# Patient Record
Sex: Female | Born: 1955
Health system: Southern US, Community
[De-identification: ages and names within clinical notes are randomized; demographics above are authoritative.]

## PROBLEM LIST (undated history)

## (undated) DIAGNOSIS — M8580 Other specified disorders of bone density and structure, unspecified site: Secondary | ICD-10-CM

## (undated) DIAGNOSIS — I10 Essential (primary) hypertension: Secondary | ICD-10-CM

## (undated) HISTORY — PX: CERVICAL DISC SURGERY: SHX588

## (undated) HISTORY — DX: Other specified disorders of bone density and structure, unspecified site: M85.80

## (undated) HISTORY — DX: Essential (primary) hypertension: I10

---

## 1996-10-02 HISTORY — PX: OTHER SURGICAL HISTORY: SHX169

## 1998-09-10 ENCOUNTER — Encounter: Payer: Self-pay | Admitting: Obstetrics and Gynecology

## 1998-09-10 ENCOUNTER — Ambulatory Visit (HOSPITAL_COMMUNITY): Admission: RE | Admit: 1998-09-10 | Discharge: 1998-09-10 | Payer: Self-pay | Admitting: Obstetrics and Gynecology

## 1998-09-17 ENCOUNTER — Ambulatory Visit (HOSPITAL_COMMUNITY): Admission: RE | Admit: 1998-09-17 | Discharge: 1998-09-17 | Payer: Self-pay | Admitting: Obstetrics and Gynecology

## 1998-09-17 ENCOUNTER — Encounter: Payer: Self-pay | Admitting: Obstetrics and Gynecology

## 1998-09-22 ENCOUNTER — Other Ambulatory Visit: Admission: RE | Admit: 1998-09-22 | Discharge: 1998-09-22 | Payer: Self-pay | Admitting: Obstetrics and Gynecology

## 1999-03-15 ENCOUNTER — Ambulatory Visit (HOSPITAL_COMMUNITY): Admission: RE | Admit: 1999-03-15 | Discharge: 1999-03-15 | Payer: Self-pay | Admitting: Obstetrics and Gynecology

## 1999-03-15 ENCOUNTER — Encounter: Payer: Self-pay | Admitting: Obstetrics and Gynecology

## 1999-04-12 ENCOUNTER — Encounter: Payer: Self-pay | Admitting: Family Medicine

## 1999-04-12 ENCOUNTER — Ambulatory Visit (HOSPITAL_COMMUNITY): Admission: RE | Admit: 1999-04-12 | Discharge: 1999-04-12 | Payer: Self-pay | Admitting: Family Medicine

## 1999-09-09 ENCOUNTER — Encounter: Payer: Self-pay | Admitting: Obstetrics and Gynecology

## 1999-09-09 ENCOUNTER — Ambulatory Visit (HOSPITAL_COMMUNITY): Admission: RE | Admit: 1999-09-09 | Discharge: 1999-09-09 | Payer: Self-pay

## 1999-09-15 ENCOUNTER — Other Ambulatory Visit: Admission: RE | Admit: 1999-09-15 | Discharge: 1999-09-15 | Payer: Self-pay | Admitting: Obstetrics and Gynecology

## 2000-09-12 ENCOUNTER — Ambulatory Visit (HOSPITAL_COMMUNITY): Admission: RE | Admit: 2000-09-12 | Discharge: 2000-09-12 | Payer: Self-pay | Admitting: Obstetrics and Gynecology

## 2000-09-12 ENCOUNTER — Encounter: Payer: Self-pay | Admitting: Obstetrics and Gynecology

## 2000-09-14 ENCOUNTER — Other Ambulatory Visit: Admission: RE | Admit: 2000-09-14 | Discharge: 2000-09-14 | Payer: Self-pay | Admitting: Obstetrics and Gynecology

## 2001-09-13 ENCOUNTER — Ambulatory Visit (HOSPITAL_COMMUNITY): Admission: RE | Admit: 2001-09-13 | Discharge: 2001-09-13 | Payer: Self-pay | Admitting: Obstetrics and Gynecology

## 2001-09-13 ENCOUNTER — Encounter: Payer: Self-pay | Admitting: Obstetrics and Gynecology

## 2002-09-15 ENCOUNTER — Ambulatory Visit (HOSPITAL_COMMUNITY): Admission: RE | Admit: 2002-09-15 | Discharge: 2002-09-15 | Payer: Self-pay | Admitting: Obstetrics and Gynecology

## 2002-09-15 ENCOUNTER — Encounter: Payer: Self-pay | Admitting: Obstetrics and Gynecology

## 2002-09-18 ENCOUNTER — Other Ambulatory Visit: Admission: RE | Admit: 2002-09-18 | Discharge: 2002-09-18 | Payer: Self-pay | Admitting: Obstetrics and Gynecology

## 2003-09-17 ENCOUNTER — Ambulatory Visit (HOSPITAL_COMMUNITY): Admission: RE | Admit: 2003-09-17 | Discharge: 2003-09-17 | Payer: Self-pay | Admitting: Obstetrics and Gynecology

## 2003-09-21 ENCOUNTER — Other Ambulatory Visit: Admission: RE | Admit: 2003-09-21 | Discharge: 2003-09-21 | Payer: Self-pay | Admitting: Obstetrics and Gynecology

## 2004-09-16 ENCOUNTER — Ambulatory Visit (HOSPITAL_COMMUNITY): Admission: RE | Admit: 2004-09-16 | Discharge: 2004-09-16 | Payer: Self-pay | Admitting: Obstetrics and Gynecology

## 2004-09-22 ENCOUNTER — Other Ambulatory Visit: Admission: RE | Admit: 2004-09-22 | Discharge: 2004-09-22 | Payer: Self-pay | Admitting: Obstetrics and Gynecology

## 2004-09-28 ENCOUNTER — Encounter: Admission: RE | Admit: 2004-09-28 | Discharge: 2004-09-28 | Payer: Self-pay | Admitting: Obstetrics and Gynecology

## 2005-09-22 ENCOUNTER — Ambulatory Visit (HOSPITAL_COMMUNITY): Admission: RE | Admit: 2005-09-22 | Discharge: 2005-09-22 | Payer: Self-pay | Admitting: Obstetrics and Gynecology

## 2005-09-28 ENCOUNTER — Other Ambulatory Visit: Admission: RE | Admit: 2005-09-28 | Discharge: 2005-09-28 | Payer: Self-pay | Admitting: Addiction Medicine

## 2006-09-27 ENCOUNTER — Ambulatory Visit (HOSPITAL_COMMUNITY): Admission: RE | Admit: 2006-09-27 | Discharge: 2006-09-27 | Payer: Self-pay | Admitting: Obstetrics and Gynecology

## 2006-10-01 ENCOUNTER — Other Ambulatory Visit: Admission: RE | Admit: 2006-10-01 | Discharge: 2006-10-01 | Payer: Self-pay | Admitting: Obstetrics and Gynecology

## 2007-09-30 ENCOUNTER — Ambulatory Visit (HOSPITAL_COMMUNITY): Admission: RE | Admit: 2007-09-30 | Discharge: 2007-09-30 | Payer: Self-pay | Admitting: Obstetrics and Gynecology

## 2007-10-11 ENCOUNTER — Other Ambulatory Visit: Admission: RE | Admit: 2007-10-11 | Discharge: 2007-10-11 | Payer: Self-pay | Admitting: Obstetrics and Gynecology

## 2008-10-19 ENCOUNTER — Encounter: Payer: Self-pay | Admitting: Obstetrics and Gynecology

## 2008-10-19 ENCOUNTER — Ambulatory Visit: Payer: Self-pay | Admitting: Obstetrics and Gynecology

## 2008-10-19 ENCOUNTER — Other Ambulatory Visit: Admission: RE | Admit: 2008-10-19 | Discharge: 2008-10-19 | Payer: Self-pay | Admitting: Obstetrics and Gynecology

## 2008-10-22 ENCOUNTER — Ambulatory Visit (HOSPITAL_COMMUNITY): Admission: RE | Admit: 2008-10-22 | Discharge: 2008-10-22 | Payer: Self-pay | Admitting: Obstetrics and Gynecology

## 2009-10-27 ENCOUNTER — Ambulatory Visit: Payer: Self-pay | Admitting: Obstetrics and Gynecology

## 2009-10-27 ENCOUNTER — Other Ambulatory Visit: Admission: RE | Admit: 2009-10-27 | Discharge: 2009-10-27 | Payer: Self-pay | Admitting: Obstetrics and Gynecology

## 2009-10-27 ENCOUNTER — Ambulatory Visit (HOSPITAL_COMMUNITY): Admission: RE | Admit: 2009-10-27 | Discharge: 2009-10-27 | Payer: Self-pay | Admitting: Obstetrics and Gynecology

## 2010-10-20 ENCOUNTER — Other Ambulatory Visit: Payer: Self-pay | Admitting: Obstetrics and Gynecology

## 2010-10-20 DIAGNOSIS — Z0000 Encounter for general adult medical examination without abnormal findings: Secondary | ICD-10-CM

## 2010-10-22 ENCOUNTER — Encounter: Payer: Self-pay | Admitting: Obstetrics and Gynecology

## 2010-11-08 ENCOUNTER — Ambulatory Visit (HOSPITAL_COMMUNITY)
Admission: RE | Admit: 2010-11-08 | Discharge: 2010-11-08 | Disposition: A | Discharge status: Home / Self Care | Payer: 59 | Source: Ambulatory Visit | Attending: Obstetrics and Gynecology | Admitting: Obstetrics and Gynecology

## 2010-11-08 DIAGNOSIS — Z0000 Encounter for general adult medical examination without abnormal findings: Secondary | ICD-10-CM

## 2010-11-08 DIAGNOSIS — Z1231 Encounter for screening mammogram for malignant neoplasm of breast: Secondary | ICD-10-CM | POA: Insufficient documentation

## 2010-11-09 ENCOUNTER — Ambulatory Visit (HOSPITAL_COMMUNITY): Admission: RE | Admit: 2010-11-09 | Payer: Self-pay | Source: Home / Self Care | Admitting: Obstetrics and Gynecology

## 2010-11-09 ENCOUNTER — Ambulatory Visit (HOSPITAL_COMMUNITY): Payer: Self-pay

## 2010-11-21 ENCOUNTER — Other Ambulatory Visit (HOSPITAL_COMMUNITY)
Admission: RE | Admit: 2010-11-21 | Discharge: 2010-11-21 | Disposition: A | Discharge status: Home / Self Care | Payer: 59 | Source: Ambulatory Visit | Attending: Obstetrics and Gynecology | Admitting: Obstetrics and Gynecology

## 2010-11-21 DIAGNOSIS — Z124 Encounter for screening for malignant neoplasm of cervix: Secondary | ICD-10-CM | POA: Insufficient documentation

## 2010-11-22 ENCOUNTER — Other Ambulatory Visit: Payer: Self-pay | Admitting: Obstetrics and Gynecology

## 2010-11-22 ENCOUNTER — Encounter (INDEPENDENT_AMBULATORY_CARE_PROVIDER_SITE_OTHER): Payer: 59 | Admitting: Obstetrics and Gynecology

## 2010-11-22 DIAGNOSIS — Z01419 Encounter for gynecological examination (general) (routine) without abnormal findings: Secondary | ICD-10-CM

## 2011-02-09 ENCOUNTER — Other Ambulatory Visit: Payer: Self-pay | Admitting: Family Medicine

## 2011-02-09 DIAGNOSIS — M79601 Pain in right arm: Secondary | ICD-10-CM

## 2011-02-13 ENCOUNTER — Ambulatory Visit
Admission: RE | Admit: 2011-02-13 | Discharge: 2011-02-13 | Disposition: A | Discharge status: Home / Self Care | Payer: 59 | Source: Ambulatory Visit | Attending: Family Medicine | Admitting: Family Medicine

## 2011-02-13 DIAGNOSIS — M79601 Pain in right arm: Secondary | ICD-10-CM

## 2011-04-10 ENCOUNTER — Ambulatory Visit
Admission: RE | Admit: 2011-04-10 | Discharge: 2011-04-10 | Disposition: A | Discharge status: Home / Self Care | Payer: 59 | Source: Ambulatory Visit | Attending: Neurological Surgery | Admitting: Neurological Surgery

## 2011-04-10 ENCOUNTER — Other Ambulatory Visit: Payer: Self-pay | Admitting: Neurological Surgery

## 2011-04-10 DIAGNOSIS — M542 Cervicalgia: Secondary | ICD-10-CM

## 2011-07-10 ENCOUNTER — Other Ambulatory Visit: Payer: Self-pay | Admitting: Neurological Surgery

## 2011-07-10 ENCOUNTER — Ambulatory Visit
Admission: RE | Admit: 2011-07-10 | Discharge: 2011-07-10 | Disposition: A | Discharge status: Home / Self Care | Payer: 59 | Source: Ambulatory Visit | Attending: Neurological Surgery | Admitting: Neurological Surgery

## 2011-07-10 DIAGNOSIS — M542 Cervicalgia: Secondary | ICD-10-CM

## 2011-08-10 ENCOUNTER — Encounter: Payer: Self-pay | Admitting: Gynecology

## 2011-08-10 DIAGNOSIS — I10 Essential (primary) hypertension: Secondary | ICD-10-CM | POA: Insufficient documentation

## 2011-08-15 ENCOUNTER — Ambulatory Visit (INDEPENDENT_AMBULATORY_CARE_PROVIDER_SITE_OTHER): Payer: 59

## 2011-08-15 ENCOUNTER — Other Ambulatory Visit: Payer: Self-pay | Admitting: Obstetrics and Gynecology

## 2011-08-15 DIAGNOSIS — M949 Disorder of cartilage, unspecified: Secondary | ICD-10-CM

## 2011-08-15 DIAGNOSIS — Z13820 Encounter for screening for osteoporosis: Secondary | ICD-10-CM

## 2011-08-15 DIAGNOSIS — M8580 Other specified disorders of bone density and structure, unspecified site: Secondary | ICD-10-CM

## 2011-08-15 DIAGNOSIS — M899 Disorder of bone, unspecified: Secondary | ICD-10-CM

## 2011-10-13 ENCOUNTER — Other Ambulatory Visit: Payer: Self-pay | Admitting: Obstetrics and Gynecology

## 2011-10-13 DIAGNOSIS — Z1231 Encounter for screening mammogram for malignant neoplasm of breast: Secondary | ICD-10-CM

## 2011-10-16 ENCOUNTER — Ambulatory Visit
Admission: RE | Admit: 2011-10-16 | Discharge: 2011-10-16 | Disposition: A | Discharge status: Home / Self Care | Payer: 59 | Source: Ambulatory Visit | Attending: Neurological Surgery | Admitting: Neurological Surgery

## 2011-10-16 ENCOUNTER — Other Ambulatory Visit: Payer: Self-pay | Admitting: Neurological Surgery

## 2011-10-16 DIAGNOSIS — M4802 Spinal stenosis, cervical region: Secondary | ICD-10-CM

## 2011-10-16 DIAGNOSIS — M542 Cervicalgia: Secondary | ICD-10-CM

## 2011-11-10 ENCOUNTER — Ambulatory Visit (HOSPITAL_COMMUNITY)
Admission: RE | Admit: 2011-11-10 | Discharge: 2011-11-10 | Disposition: A | Discharge status: Home / Self Care | Payer: 59 | Source: Ambulatory Visit | Attending: Obstetrics and Gynecology | Admitting: Obstetrics and Gynecology

## 2011-11-10 DIAGNOSIS — Z1231 Encounter for screening mammogram for malignant neoplasm of breast: Secondary | ICD-10-CM | POA: Insufficient documentation

## 2011-11-28 ENCOUNTER — Other Ambulatory Visit (HOSPITAL_COMMUNITY)
Admission: RE | Admit: 2011-11-28 | Discharge: 2011-11-28 | Disposition: A | Discharge status: Home / Self Care | Payer: 59 | Source: Ambulatory Visit | Attending: Obstetrics and Gynecology | Admitting: Obstetrics and Gynecology

## 2011-11-28 ENCOUNTER — Ambulatory Visit (INDEPENDENT_AMBULATORY_CARE_PROVIDER_SITE_OTHER): Payer: 59 | Admitting: Obstetrics and Gynecology

## 2011-11-28 ENCOUNTER — Encounter: Payer: Self-pay | Admitting: Obstetrics and Gynecology

## 2011-11-28 VITALS — BP 136/80 | Ht 60.5 in | Wt 206.0 lb

## 2011-11-28 DIAGNOSIS — M899 Disorder of bone, unspecified: Secondary | ICD-10-CM

## 2011-11-28 DIAGNOSIS — Z01419 Encounter for gynecological examination (general) (routine) without abnormal findings: Secondary | ICD-10-CM

## 2011-11-28 DIAGNOSIS — M8580 Other specified disorders of bone density and structure, unspecified site: Secondary | ICD-10-CM | POA: Insufficient documentation

## 2011-11-28 NOTE — Progress Notes (Signed)
Patient came to see me today for her annual GYN exam. She had elevated FSH in 2010. She is stopped pills. She did actually have 3 periods the last year. She is not contracepting. She knows that although  pregnancy is unlikely it is not impossible. She is having no pelvic pain. She is up-to-date on mammograms. She is at baseline bone density in December which showed osteopenia without an elevated FRAX risk. She's had no fractures. She does lab through PCP. When I saw her last year she was having lack of energy and feeling bad. We offered her HRT. She decided not to take it. She now feels fine.  Physical examination: Jessica Ramirez gardner present. HEENT within normal limits. Neck: Thyroid not large. No masses. Supraclavicular nodes: not enlarged. Breasts: Examined in both sitting midline position. No skin changes and no masses. Abdomen: Soft no guarding rebound or masses or hernia. Pelvic: External: Within normal limits. BUS: Within normal limits. Vaginal:within normal limits. Good estrogen effect. No evidence of cystocele rectocele or enterocele. Cervix: clean. Uterus: Normal size and shape. Adnexa: No masses. Rectovaginal exam: Confirmatory and negative. Extremities: Within normal limits.  Assessment: Osteopenia  Plan: Discussed bone density. Start her on calcium and vitamin D. Discussed issues with getting pregnant accidentally. Discussed condoms. Patient has elected to continue not to use birth control. Continue yearly mammograms.

## 2011-11-29 LAB — URINALYSIS W MICROSCOPIC + REFLEX CULTURE
Bacteria, UA: NONE SEEN
Bilirubin Urine: NEGATIVE
Casts: NONE SEEN
Crystals: NONE SEEN
Glucose, UA: NEGATIVE mg/dL
Hgb urine dipstick: NEGATIVE
Ketones, ur: NEGATIVE mg/dL
Leukocytes, UA: NEGATIVE
Protein, ur: NEGATIVE mg/dL
Urobilinogen, UA: 0.2 mg/dL (ref 0.0–1.0)
pH: 6 (ref 5.0–8.0)

## 2012-11-11 ENCOUNTER — Other Ambulatory Visit: Payer: Self-pay | Admitting: Gynecology

## 2012-11-11 DIAGNOSIS — Z1231 Encounter for screening mammogram for malignant neoplasm of breast: Secondary | ICD-10-CM

## 2012-11-21 ENCOUNTER — Ambulatory Visit (HOSPITAL_COMMUNITY)
Admission: RE | Admit: 2012-11-21 | Discharge: 2012-11-21 | Disposition: A | Discharge status: Home / Self Care | Payer: 59 | Source: Ambulatory Visit | Attending: Gynecology | Admitting: Gynecology

## 2012-11-21 DIAGNOSIS — Z1231 Encounter for screening mammogram for malignant neoplasm of breast: Secondary | ICD-10-CM | POA: Insufficient documentation

## 2012-11-28 ENCOUNTER — Ambulatory Visit (INDEPENDENT_AMBULATORY_CARE_PROVIDER_SITE_OTHER): Payer: 59 | Admitting: Gynecology

## 2012-11-28 ENCOUNTER — Encounter: Payer: Self-pay | Admitting: Gynecology

## 2012-11-28 ENCOUNTER — Other Ambulatory Visit: Payer: Self-pay | Admitting: Gynecology

## 2012-11-28 ENCOUNTER — Other Ambulatory Visit (HOSPITAL_COMMUNITY)
Admission: RE | Admit: 2012-11-28 | Discharge: 2012-11-28 | Disposition: A | Discharge status: Home / Self Care | Payer: 59 | Source: Ambulatory Visit | Attending: Gynecology | Admitting: Gynecology

## 2012-11-28 VITALS — BP 124/74 | Ht 62.0 in | Wt 194.0 lb

## 2012-11-28 DIAGNOSIS — N390 Urinary tract infection, site not specified: Secondary | ICD-10-CM

## 2012-11-28 DIAGNOSIS — L292 Pruritus vulvae: Secondary | ICD-10-CM

## 2012-11-28 DIAGNOSIS — L293 Anogenital pruritus, unspecified: Secondary | ICD-10-CM

## 2012-11-28 DIAGNOSIS — R350 Frequency of micturition: Secondary | ICD-10-CM

## 2012-11-28 DIAGNOSIS — Z01419 Encounter for gynecological examination (general) (routine) without abnormal findings: Secondary | ICD-10-CM | POA: Insufficient documentation

## 2012-11-28 LAB — URINALYSIS W MICROSCOPIC + REFLEX CULTURE
Bilirubin Urine: NEGATIVE
Casts: NONE SEEN
Crystals: NONE SEEN
Glucose, UA: NEGATIVE mg/dL
Ketones, ur: NEGATIVE mg/dL
Nitrite: NEGATIVE
Protein, ur: NEGATIVE mg/dL
Urobilinogen, UA: 0.2 mg/dL (ref 0.0–1.0)
pH: 6 (ref 5.0–8.0)

## 2012-11-28 MED ORDER — NITROFURANTOIN MONOHYD MACRO 100 MG PO CAPS: 100.0000 mg | ORAL_CAPSULE | Freq: Two times a day (BID) | ORAL | Status: DC

## 2012-11-28 MED ORDER — FLUCONAZOLE 200 MG PO TABS: 200.0000 mg | ORAL_TABLET | Freq: Every day | ORAL | Status: DC

## 2012-11-28 NOTE — Patient Instructions (Signed)
Take Macrobid antibiotic twice daily for 7 days. Take Diflucan pill daily for 3 days. Followup if your symptoms persist or recur. Follow up in one year for annual exam. Report if you have prolonged or atypical bleeding.

## 2012-11-28 NOTE — Progress Notes (Signed)
Jessica Ramirez 12-23-1955 [id number]        57 y.o.  G0P0 for annual exam.  Former patient of Dr. Eda Paschal with several issues.  Past medical history,surgical history, medications, allergies, family history and social history were all reviewed and documented in the EPIC chart. ROS:  Was performed and pertinent positives and negatives are included in the history.  Exam: Kim assistant Filed Vitals:   11/28/12 1540  BP: 124/74  Height: 5\' 2"  (1.575 m)  Weight: 194 lb (87.998 kg)   General appearance  Normal Skin grossly normal Head/Neck normal with no cervical or supraclavicular adenopathy thyroid normal Lungs  clear Cardiac RR, without RMG Abdominal  soft, nontender, without masses, organomegaly or hernia Breasts  examined lying and sitting without masses, retractions, discharge or axillary adenopathy. Pelvic  Ext/BUS/vagina  normal   Cervix  normal Pap/HPV  Uterus  axial, normal size, shape and contour, midline and mobile nontender   Adnexa  Without masses or tenderness    Anus and perineum  normal   Rectovaginal  normal sphincter tone without palpated masses or tenderness.    Assessment/Plan:  57 y.o. G0P0 female for annual exam.   1. UTI. Patient with several days of frequency dysuria and urgency incontinence. No fever chills or other symptoms. Urinalysis consistent with UTI. Will treat with Macrobid 100 twice a day time 7 days. Samples of Urogesic #4 given to take every 6 hours. 2. Vulvar perianal pruritus. Followed colonoscopy. I suspect she received antibiotics and probably has a low-grade fungal. We'll cover with Diflucan 200 milligrams daily x3 days followup if symptoms persist or recur. 3. Perimenopausal. Just had menses and was 8 months from her prior menses. No real significant hot flashes or sweats.  Will keep menstrual calendar as long as less frequent but regular menses we'll follow. It goes more than one year without bleeding and then has bleeding or as prolonged or  atypical bleeding she does report this. Need for contraception until she is one year past menses discussed. 4. Mammography 11/2012 normal. Continue with annual mammography. SBE monthly reviewed. 5. Pap smear 11/2011. Pap done today. Discussed current screening guidelines but she was uncomfortable with less frequent screening. 6. Colonoscopy 09/2012. 7. Health maintenance. No blood work done today as it is all done through her primary physician's office. Followup in one year or sooner if any issues.    Dara Lords MD, 4:37 PM 11/28/2012

## 2012-11-30 LAB — URINE CULTURE: Colony Count: >=100000

## 2012-12-12 ENCOUNTER — Telehealth: Payer: Self-pay | Admitting: *Deleted

## 2012-12-12 MED ORDER — CIPROFLOXACIN HCL 250 MG PO TABS: 250.0000 mg | ORAL_TABLET | Freq: Two times a day (BID) | ORAL | Status: DC

## 2012-12-12 MED ORDER — PHENAZOPYRIDINE HCL 200 MG PO TABS: 200.0000 mg | ORAL_TABLET | Freq: Three times a day (TID) | ORAL | Status: DC | PRN

## 2012-12-12 NOTE — Telephone Encounter (Signed)
Pt was given Macrobid 100 twice a day time 7 days. Samples of Urogesic #4 given to take every 6 hours 11/28/12. Pt said yesterday symptoms returned pressure and frequency, no temp, she was cold this am. She asked if another rx could be given and urogesic as well? Please advise

## 2012-12-12 NOTE — Telephone Encounter (Signed)
Pt informed with the below note. 

## 2012-12-12 NOTE — Telephone Encounter (Signed)
Ciprofloxacin 250 mg twice a day x7 days, Pyridium 200 mg 3 times a day times #10. Orders done

## 2013-10-24 ENCOUNTER — Other Ambulatory Visit: Payer: Self-pay | Admitting: Gynecology

## 2013-10-24 DIAGNOSIS — Z1231 Encounter for screening mammogram for malignant neoplasm of breast: Secondary | ICD-10-CM

## 2013-11-24 ENCOUNTER — Ambulatory Visit (HOSPITAL_COMMUNITY)
Admission: RE | Admit: 2013-11-24 | Discharge: 2013-11-24 | Disposition: A | Discharge status: Home / Self Care | Payer: PRIVATE HEALTH INSURANCE | Source: Ambulatory Visit | Attending: Gynecology | Admitting: Gynecology

## 2013-11-24 DIAGNOSIS — Z1231 Encounter for screening mammogram for malignant neoplasm of breast: Secondary | ICD-10-CM | POA: Insufficient documentation

## 2013-12-01 ENCOUNTER — Ambulatory Visit (INDEPENDENT_AMBULATORY_CARE_PROVIDER_SITE_OTHER): Payer: Managed Care, Other (non HMO) | Admitting: Gynecology

## 2013-12-01 ENCOUNTER — Encounter: Payer: Self-pay | Admitting: Gynecology

## 2013-12-01 VITALS — BP 120/78 | Ht 60.0 in | Wt 190.0 lb

## 2013-12-01 DIAGNOSIS — L290 Pruritus ani: Secondary | ICD-10-CM

## 2013-12-01 DIAGNOSIS — L293 Anogenital pruritus, unspecified: Secondary | ICD-10-CM

## 2013-12-01 DIAGNOSIS — Z01419 Encounter for gynecological examination (general) (routine) without abnormal findings: Secondary | ICD-10-CM

## 2013-12-01 DIAGNOSIS — L292 Pruritus vulvae: Secondary | ICD-10-CM

## 2013-12-01 DIAGNOSIS — M949 Disorder of cartilage, unspecified: Secondary | ICD-10-CM

## 2013-12-01 DIAGNOSIS — M8580 Other specified disorders of bone density and structure, unspecified site: Secondary | ICD-10-CM

## 2013-12-01 DIAGNOSIS — M899 Disorder of bone, unspecified: Secondary | ICD-10-CM

## 2013-12-01 MED ORDER — NYSTATIN-TRIAMCINOLONE 100000-0.1 UNIT/GM-% EX OINT: 1.0000 "application " | TOPICAL_OINTMENT | Freq: Two times a day (BID) | CUTANEOUS | Status: DC

## 2013-12-01 MED ORDER — FLUCONAZOLE 200 MG PO TABS: 200.0000 mg | ORAL_TABLET | Freq: Every day | ORAL | Status: DC

## 2013-12-01 NOTE — Patient Instructions (Signed)
Followup in one year for annual exam, sooner if any issues 

## 2013-12-01 NOTE — Progress Notes (Signed)
Jessica Ramirez 10/23/1955 [id number]        58 y.o.  G0P0 for annual exam.  Several issues below.  Past medical history,surgical history, problem list, medications, allergies, family history and social history were all reviewed and documented in the EPIC chart.  ROS:  Performed and pertinent positives and negatives are included in the history, assessment and plan .  Exam: Jessica Ramirez assistant Filed Vitals:   12/01/13 1549  BP: 120/78  Height: 5' (1.524 m)  Weight: 190 lb (86.183 kg)   General appearance  Normal Skin grossly normal Head/Neck normal with no cervical or supraclavicular adenopathy thyroid normal Lungs  clear Cardiac RR, without RMG Abdominal  soft, nontender, without masses, organomegaly or hernia Breasts  examined lying and sitting without masses, retractions, discharge or axillary adenopathy. Pelvic  Ext/BUS/vagina normal  Cervix normal  Uterus anteverted, normal size, shape and contour, midline and mobile nontender   Adnexa  Without masses or tenderness    Anus and perineum  Normal   Rectovaginal  Normal sphincter tone without palpated masses or tenderness.    Assessment/Plan:  58 y.o. G0P0 female for annual exam.   1. Postmenopausal. Patient's last menstrual period was February 2014. No bleeding since. Having minimal hot flushes and night sweats. No issues with vaginal dryness or dyspareunia. Will continue to monitor. Patient knows to report any vaginal bleeding. 2. Pruritus ani. Patient has an issue with intermittent perianal vulvar itching. Was treated with Diflucan last year but cannot remember how well it worked. Exam grossly is normal. I did not do a wet prep is I'm going to treat her regardless with Diflucan 200 mg daily x5 days and Mytrex cream nightly as needed. Followup if symptoms persist, worsen or recur. 3. Osteopenia. DEXA 08/2011 T score -1.3. FRAX 4.6%/0.3%. Repeat DEXA now at 2-1/2 year interval. Increase calcium vitamin D reviewed. 4. Pap smear 2014. No  Pap smear done today. No history of significant abnormal Pap smears. Plan repeat Pap smear at 3 year interval. 5. Mammography 11/2013. Continue with annual mammography. SBE monthly reviewed. 6. Colonoscopy 2013. Repeat at their recommended interval. 7. Health maintenance. No blood work done as she reports this all done through her primary physician's office. Followup one year, sooner as needed.   Note: This document was prepared with digital dictation and possible smart phrase technology. Any transcriptional errors that result from this process are unintentional.   Jessica Ramirez,Jessica Ramirez P MD, 4:34 PM 12/01/2013

## 2014-01-30 DIAGNOSIS — M8580 Other specified disorders of bone density and structure, unspecified site: Secondary | ICD-10-CM

## 2014-01-30 HISTORY — DX: Other specified disorders of bone density and structure, unspecified site: M85.80

## 2014-02-16 ENCOUNTER — Ambulatory Visit (INDEPENDENT_AMBULATORY_CARE_PROVIDER_SITE_OTHER): Payer: Managed Care, Other (non HMO)

## 2014-02-16 DIAGNOSIS — M949 Disorder of cartilage, unspecified: Secondary | ICD-10-CM

## 2014-02-16 DIAGNOSIS — M8580 Other specified disorders of bone density and structure, unspecified site: Secondary | ICD-10-CM

## 2014-02-16 DIAGNOSIS — M899 Disorder of bone, unspecified: Secondary | ICD-10-CM

## 2014-02-24 ENCOUNTER — Encounter: Payer: Self-pay | Admitting: Gynecology

## 2014-04-13 ENCOUNTER — Telehealth: Payer: Self-pay | Admitting: *Deleted

## 2014-04-13 NOTE — Telephone Encounter (Signed)
Pt calling c/o UTI requesting Rx, I explained to patient OV best. Pt in boone [place] now and will go to urgent care.

## 2014-05-04 IMAGING — MG MM DIGITAL SCREENING BILAT
5 series · 5 of 5 positions shown · non-contrast
Comparison: Previous exams.

CLINICAL DATA: Screening.

DIGITAL BILATERAL SCREENING MAMMOGRAM WITH CAD

[R CC]
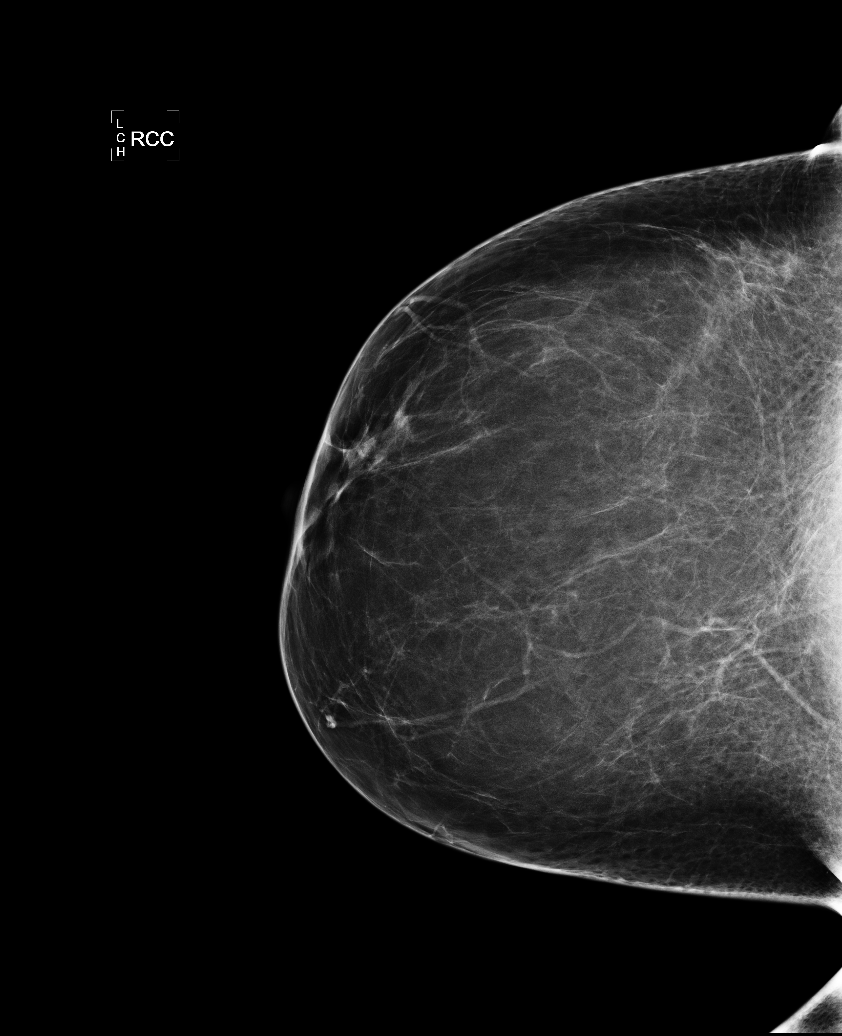

[R MLO]
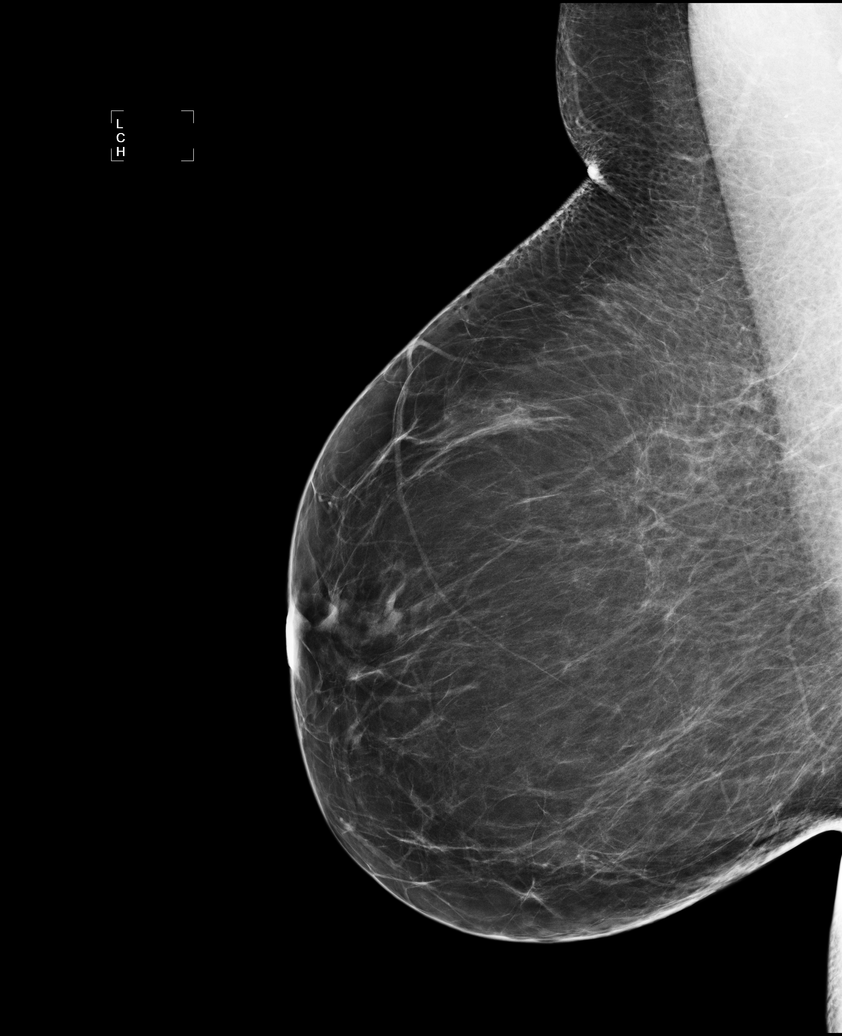

[L CC]
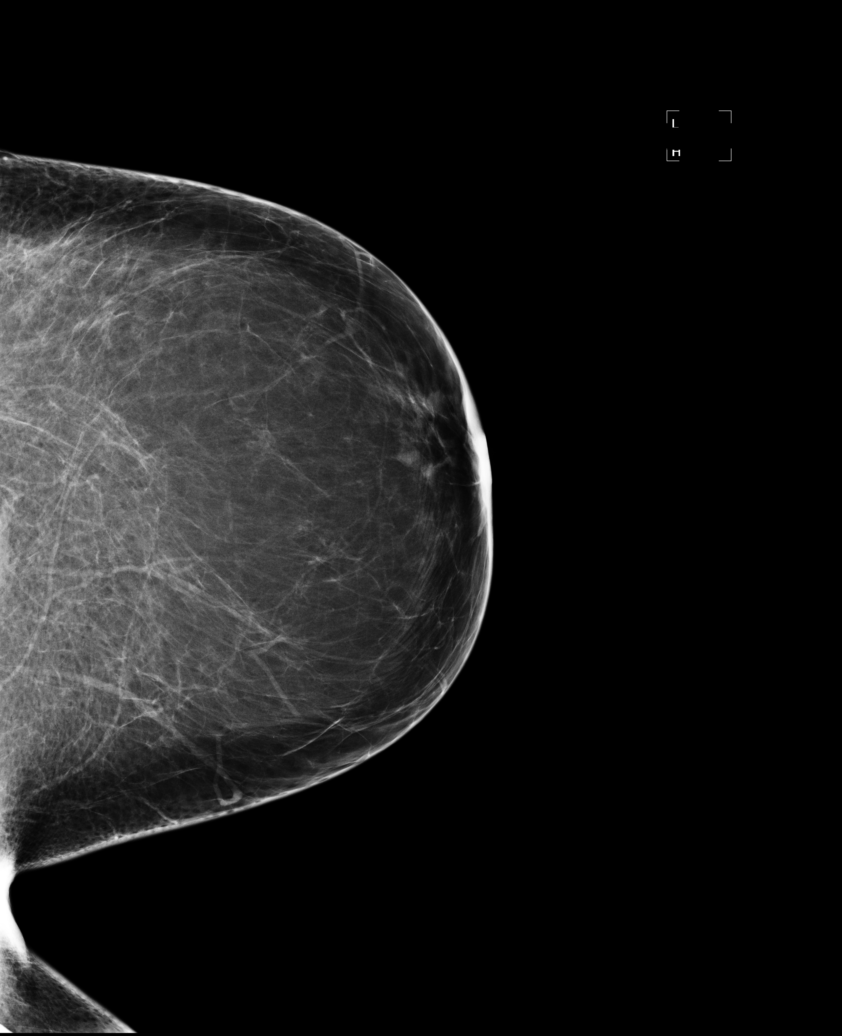

[L MLO (1 of 2)]
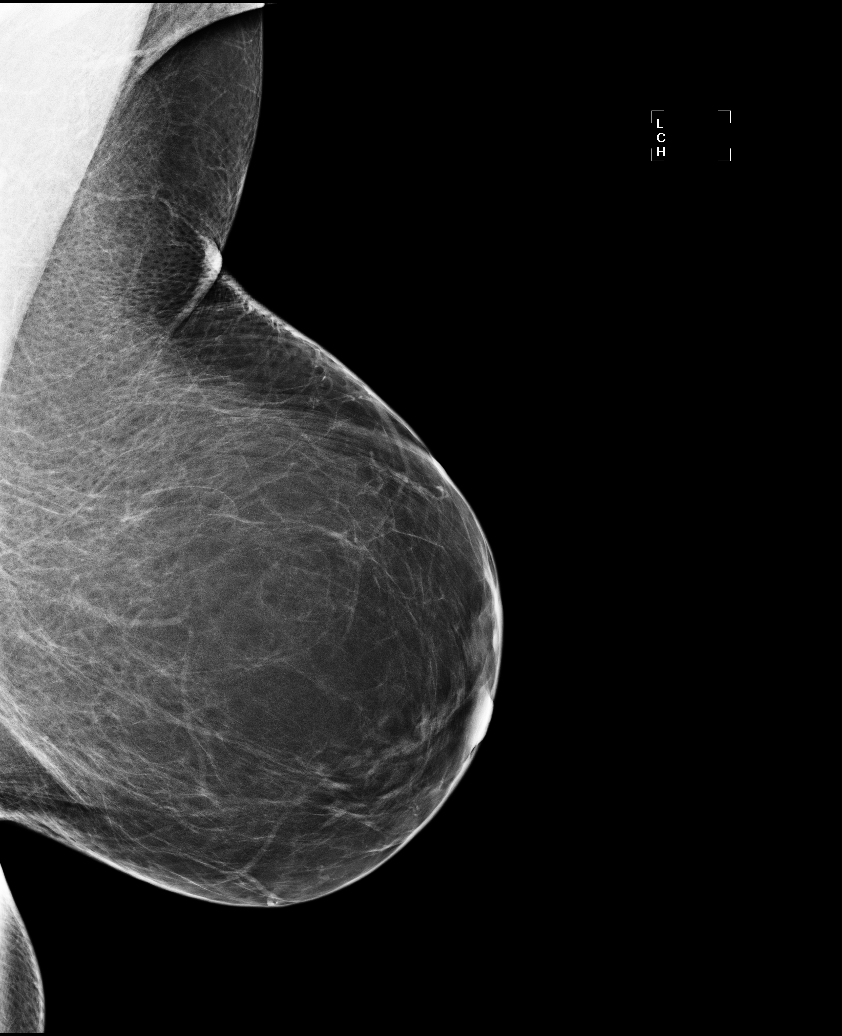

[L MLO (2 of 2)]
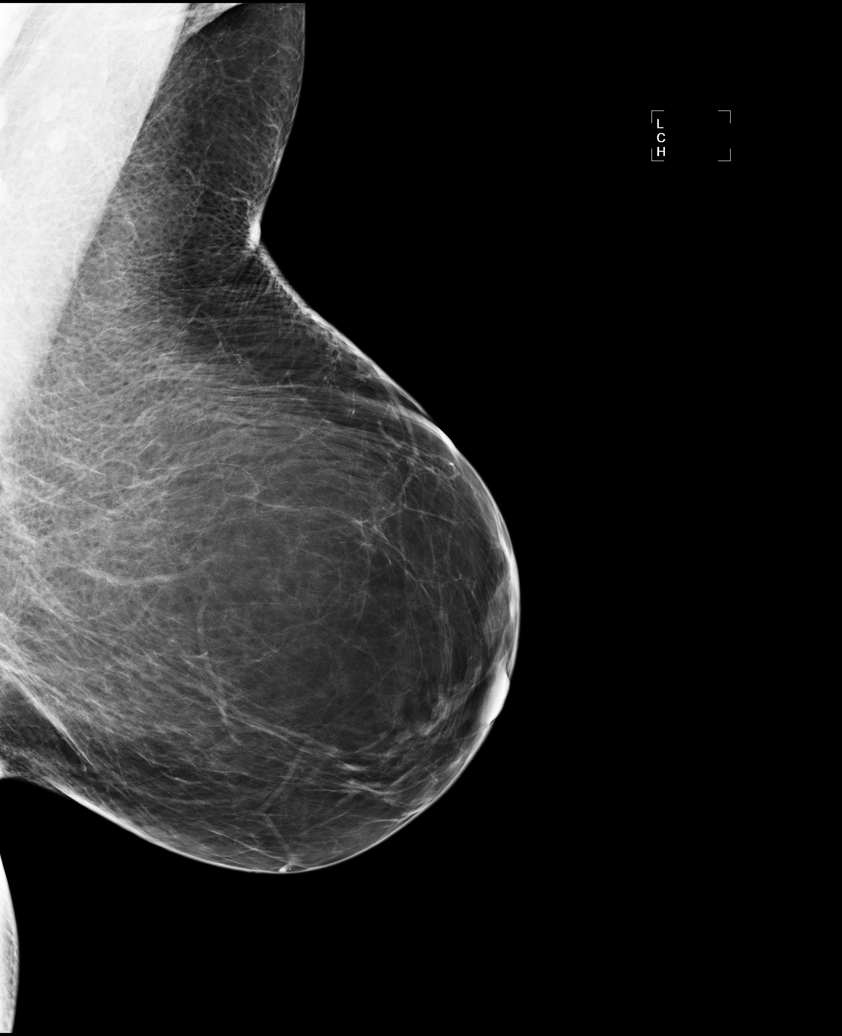

[5 of 5 positions shown; findings below may reference images not displayed]

FINDINGS: ACR Breast Density Category 2: There is a scattered fibroglandular
pattern.

No suspicious masses, architectural distortion, or calcifications
are present.

Images were processed with CAD.
IMPRESSION: No mammographic evidence of malignancy.

A result letter of this screening mammogram will be mailed directly
to the patient.

RECOMMENDATION:
Screening mammogram in one year. (Code:[id number])

BI-RADS CATEGORY 1:  Negative.

## 2014-12-07 ENCOUNTER — Other Ambulatory Visit: Payer: Self-pay | Admitting: Gynecology

## 2014-12-07 DIAGNOSIS — Z1231 Encounter for screening mammogram for malignant neoplasm of breast: Secondary | ICD-10-CM

## 2014-12-30 ENCOUNTER — Ambulatory Visit (HOSPITAL_COMMUNITY)
Admission: RE | Admit: 2014-12-30 | Discharge: 2014-12-30 | Disposition: A | Discharge status: Home / Self Care | Payer: 59 | Source: Ambulatory Visit | Attending: Gynecology | Admitting: Gynecology

## 2014-12-30 DIAGNOSIS — Z1231 Encounter for screening mammogram for malignant neoplasm of breast: Secondary | ICD-10-CM | POA: Diagnosis not present

## 2015-01-22 ENCOUNTER — Encounter: Payer: Managed Care, Other (non HMO) | Admitting: Gynecology

## 2015-04-13 ENCOUNTER — Ambulatory Visit (INDEPENDENT_AMBULATORY_CARE_PROVIDER_SITE_OTHER): Payer: BLUE CROSS/BLUE SHIELD | Admitting: Gynecology

## 2015-04-13 ENCOUNTER — Encounter: Payer: Self-pay | Admitting: Gynecology

## 2015-04-13 VITALS — BP 112/70 | Ht 61.0 in | Wt 196.0 lb

## 2015-04-13 DIAGNOSIS — Z01419 Encounter for gynecological examination (general) (routine) without abnormal findings: Secondary | ICD-10-CM | POA: Diagnosis not present

## 2015-04-13 MED ORDER — NYSTATIN-TRIAMCINOLONE 100000-0.1 UNIT/GM-% EX OINT: 1.0000 "application " | TOPICAL_OINTMENT | Freq: Two times a day (BID) | CUTANEOUS | Status: DC

## 2015-04-13 NOTE — Progress Notes (Signed)
Jessica Ramirez 11/23/1955 [id number]        59 y.o.  G0P0 for annual exam.  Doing well without complaints.  Past medical history,surgical history, problem list, medications, allergies, family history and social history were all reviewed and documented as reviewed in the EPIC chart.  ROS:  Performed with pertinent positives and negatives included in the history, assessment and plan.   Additional significant findings :  none   Exam: [facility] personassistant Filed Vitals:   04/13/15 1545  BP: 112/70  Height: 5\' 1"  (1.549 m)  Weight: 196 lb (88.905 kg)   General appearance:  Normal affect, orientation and appearance. Skin: Grossly normal HEENT: Without gross lesions.  No cervical or supraclavicular adenopathy. Thyroid normal.  Lungs:  Clear without wheezing, rales or rhonchi Cardiac: RR, without RMG Abdominal:  Soft, nontender, without masses, guarding, rebound, organomegaly or hernia Breasts:  Examined lying and sitting without masses, retractions, discharge or axillary adenopathy. Pelvic:  Ext/BUS/vagina normal  Cervix normal  Uterus anteverted, normal size, shape and contour, midline and mobile nontender   Adnexa  Without masses or tenderness    Anus and perineum  Normal   Rectovaginal  Normal sphincter tone without palpated masses or tenderness.    Assessment/Plan:  59 y.o. G0P0 female for annual exam.   1. Postmenopausal/atrophic genital changes. Patient doing well without significant hot flashes, night sweats, vaginal dryness or any vaginal bleeding. Continue to monitor and report any vaginal bleeding. 2. History of pruritus ani.  Uses Mycolog ointment occasionally which helps.  Will refill at her request. 3. Osteopenia.  DEXA 01/2014 T score -1.1 FRAX 5%/0.3%. Plan repeat DEXA in several years. Increased calcium and vitamin D reviewed. 4. Pap smear 2014. No Pap smear done today. Will do Pap smear next year at three-year interval. No history of abnormal Pap smears  previously. 5. Mammography 12/2014.  Continue with annual mammography. SBE monthly reviewed. 6. Colonoscopy 2013. Repeat at their recommended interval. 7. Health maintenance. No routine blood work done as patient reports is done at her primary physician's office. Follow up in one year, sooner as needed.   Dara LordsFONTAINE,TIMOTHY P MD, 4:16 PM 04/13/2015

## 2015-04-13 NOTE — Patient Instructions (Signed)
You may obtain a copy of any labs that were done today by logging onto MyChart as outlined in the instructions provided with your AVS (after visit summary). The office will not call with normal lab results but certainly if there are any significant abnormalities then we will contact you.   Health Maintenance, Female A healthy lifestyle and preventative care can promote health and wellness.  Maintain regular health, dental, and eye exams.  Eat a healthy diet. Foods like vegetables, fruits, whole grains, low-fat dairy products, and lean protein foods contain the nutrients you need without too many calories. Decrease your intake of foods high in solid fats, added sugars, and salt. Get information about a proper diet from your caregiver, if necessary.  Regular physical exercise is one of the most important things you can do for your health. Most adults should get at least 150 minutes of moderate-intensity exercise (any activity that increases your heart rate and causes you to sweat) each week. In addition, most adults need muscle-strengthening exercises on 2 or more days a week.   Maintain a healthy weight. The body mass index (BMI) is a screening tool to identify possible weight problems. It provides an estimate of body fat based on height and weight. Your caregiver can help determine your BMI, and can help you achieve or maintain a healthy weight. For adults 20 years and older:  A BMI below 18.5 is considered underweight.  A BMI of 18.5 to 24.9 is normal.  A BMI of 25 to 29.9 is considered overweight.  A BMI of 30 and above is considered obese.  Maintain normal blood lipids and cholesterol by exercising and minimizing your intake of saturated fat. Eat a balanced diet with plenty of fruits and vegetables. Blood tests for lipids and cholesterol should begin at age 61 and be repeated every 5 years. If your lipid or cholesterol levels are high, you are over 50, or you are a high risk for heart  disease, you may need your cholesterol levels checked more frequently.Ongoing high lipid and cholesterol levels should be treated with medicines if diet and exercise are not effective.  If you smoke, find out from your caregiver how to quit. If you do not use tobacco, do not start.  Lung cancer screening is recommended for adults aged 33 80 years who are at high risk for developing lung cancer because of a history of smoking. Yearly low-dose computed tomography (CT) is recommended for people who have at least a 30-pack-year history of smoking and are a current smoker or have quit within the past 15 years. A pack year of smoking is smoking an average of 1 pack of cigarettes a day for 1 year (for example: 1 pack a day for 30 years or 2 packs a day for 15 years). Yearly screening should continue until the smoker has stopped smoking for at least 15 years. Yearly screening should also be stopped for people who develop a health problem that would prevent them from having lung cancer treatment.  If you are pregnant, do not drink alcohol. If you are breastfeeding, be very cautious about drinking alcohol. If you are not pregnant and choose to drink alcohol, do not exceed 1 drink per day. One drink is considered to be 12 ounces (355 mL) of beer, 5 ounces (148 mL) of wine, or 1.5 ounces (44 mL) of liquor.  Avoid use of street drugs. Do not share needles with anyone. Ask for help if you need support or instructions about stopping  the use of drugs.  High blood pressure causes heart disease and increases the risk of stroke. Blood pressure should be checked at least every 1 to 2 years. Ongoing high blood pressure should be treated with medicines, if weight loss and exercise are not effective.  If you are 59 to 57 years old, ask your caregiver if you should take aspirin to prevent strokes.  Diabetes screening involves taking a blood sample to check your fasting blood sugar level. This should be done once every 3  years, after age 91, if you are within normal weight and without risk factors for diabetes. Testing should be considered at a younger age or be carried out more frequently if you are overweight and have at least 1 risk factor for diabetes.  Breast cancer screening is essential preventative care for women. You should practice "breast self-awareness." This means understanding the normal appearance and feel of your breasts and may include breast self-examination. Any changes detected, no matter how small, should be reported to a caregiver. Women in their 66s and 30s should have a clinical breast exam (CBE) by a caregiver as part of a regular health exam every 1 to 3 years. After age 101, women should have a CBE every year. Starting at age 100, women should consider having a mammogram (breast X-ray) every year. Women who have a family history of breast cancer should talk to their caregiver about genetic screening. Women at a high risk of breast cancer should talk to their caregiver about having an MRI and a mammogram every year.  Breast cancer gene (BRCA)-related cancer risk assessment is recommended for women who have family members with BRCA-related cancers. BRCA-related cancers include breast, ovarian, tubal, and peritoneal cancers. Having family members with these cancers may be associated with an increased risk for harmful changes (mutations) in the breast cancer genes BRCA1 and BRCA2. Results of the assessment will determine the need for genetic counseling and BRCA1 and BRCA2 testing.  The Pap test is a screening test for cervical cancer. Women should have a Pap test starting at age 57. Between ages 25 and 35, Pap tests should be repeated every 2 years. Beginning at age 37, you should have a Pap test every 3 years as long as the past 3 Pap tests have been normal. If you had a hysterectomy for a problem that was not cancer or a condition that could lead to cancer, then you no longer need Pap tests. If you are  between ages 50 and 76, and you have had normal Pap tests going back 10 years, you no longer need Pap tests. If you have had past treatment for cervical cancer or a condition that could lead to cancer, you need Pap tests and screening for cancer for at least 20 years after your treatment. If Pap tests have been discontinued, risk factors (such as a new sexual partner) need to be reassessed to determine if screening should be resumed. Some women have medical problems that increase the chance of getting cervical cancer. In these cases, your caregiver may recommend more frequent screening and Pap tests.  The human papillomavirus (HPV) test is an additional test that may be used for cervical cancer screening. The HPV test looks for the virus that can cause the cell changes on the cervix. The cells collected during the Pap test can be tested for HPV. The HPV test could be used to screen women aged 44 years and older, and should be used in women of any age  who have unclear Pap test results. After the age of 55, women should have HPV testing at the same frequency as a Pap test.  Colorectal cancer can be detected and often prevented. Most routine colorectal cancer screening begins at the age of 44 and continues through age 20. However, your caregiver may recommend screening at an earlier age if you have risk factors for colon cancer. On a yearly basis, your caregiver may provide home test kits to check for hidden blood in the stool. Use of a small camera at the end of a tube, to directly examine the colon (sigmoidoscopy or colonoscopy), can detect the earliest forms of colorectal cancer. Talk to your caregiver about this at age 86, when routine screening begins. Direct examination of the colon should be repeated every 5 to 10 years through age 13, unless early forms of pre-cancerous polyps or small growths are found.  Hepatitis C blood testing is recommended for all people born from [birth year] through 1963 and any  individual with known risks for hepatitis C.  Practice safe sex. Use condoms and avoid high-risk sexual practices to reduce the spread of sexually transmitted infections (STIs). Sexually active women aged 36 and younger should be checked for Chlamydia, which is a common sexually transmitted infection. Older women with new or multiple partners should also be tested for Chlamydia. Testing for other STIs is recommended if you are sexually active and at increased risk.  Osteoporosis is a disease in which the bones lose minerals and strength with aging. This can result in serious bone fractures. The risk of osteoporosis can be identified using a bone density scan. Women ages 20 and over and women at risk for fractures or osteoporosis should discuss screening with their caregivers. Ask your caregiver whether you should be taking a calcium supplement or vitamin D to reduce the rate of osteoporosis.  Menopause can be associated with physical symptoms and risks. Hormone replacement therapy is available to decrease symptoms and risks. You should talk to your caregiver about whether hormone replacement therapy is right for you.  Use sunscreen. Apply sunscreen liberally and repeatedly throughout the day. You should seek shade when your shadow is shorter than you. Protect yourself by wearing long sleeves, pants, a wide-brimmed hat, and sunglasses year round, whenever you are outdoors.  Notify your caregiver of new moles or changes in moles, especially if there is a change in shape or color. Also notify your caregiver if a mole is larger than the size of a pencil eraser.  Stay current with your immunizations. Document Released: 04/03/2011 Document Revised: 01/13/2013 Document Reviewed: 04/03/2011 [facility] Patient Information 2014 [employer].

## 2016-01-19 ENCOUNTER — Other Ambulatory Visit: Payer: Self-pay

## 2016-01-19 DIAGNOSIS — Z1231 Encounter for screening mammogram for malignant neoplasm of breast: Secondary | ICD-10-CM

## 2016-02-03 ENCOUNTER — Ambulatory Visit
Admission: RE | Admit: 2016-02-03 | Discharge: 2016-02-03 | Disposition: A | Discharge status: Home / Self Care | Payer: BLUE CROSS/BLUE SHIELD | Source: Ambulatory Visit

## 2016-02-03 DIAGNOSIS — Z1231 Encounter for screening mammogram for malignant neoplasm of breast: Secondary | ICD-10-CM

## 2016-04-13 ENCOUNTER — Encounter: Payer: BLUE CROSS/BLUE SHIELD | Admitting: Gynecology

## 2016-06-07 ENCOUNTER — Encounter: Payer: Self-pay | Admitting: Gynecology

## 2016-06-07 ENCOUNTER — Ambulatory Visit (INDEPENDENT_AMBULATORY_CARE_PROVIDER_SITE_OTHER): Payer: BLUE CROSS/BLUE SHIELD | Admitting: Gynecology

## 2016-06-07 VITALS — BP 120/70 | Ht 61.0 in | Wt 206.0 lb

## 2016-06-07 DIAGNOSIS — M8580 Other specified disorders of bone density and structure, unspecified site: Secondary | ICD-10-CM

## 2016-06-07 DIAGNOSIS — Z01419 Encounter for gynecological examination (general) (routine) without abnormal findings: Secondary | ICD-10-CM

## 2016-06-07 MED ORDER — NYSTATIN-TRIAMCINOLONE 100000-0.1 UNIT/GM-% EX OINT: 1.0000 "application " | TOPICAL_OINTMENT | Freq: Two times a day (BID) | CUTANEOUS | 1 refills | Status: AC

## 2016-06-07 NOTE — Patient Instructions (Signed)
Check with your other doctor to make sure that you're having your vitamin D checked.  You may obtain a copy of any labs that were done today by logging onto MyChart as outlined in the instructions provided with your AVS (after visit summary). The office will not call with normal lab results but certainly if there are any significant abnormalities then we will contact you.   Health Maintenance Adopting a healthy lifestyle and getting preventive care can go a long way to promote health and wellness. Talk with your health care provider about what schedule of regular examinations is right for you. This is a good chance for you to check in with your provider about disease prevention and staying healthy. In between checkups, there are plenty of things you can do on your own. Experts have done a lot of research about which lifestyle changes and preventive measures are most likely to keep you healthy. Ask your health care provider for more information. WEIGHT AND DIET  Eat a healthy diet  Be sure to include plenty of vegetables, fruits, low-fat dairy products, and lean protein.  Do not eat a lot of foods high in solid fats, added sugars, or salt.  Get regular exercise. This is one of the most important things you can do for your health.  Most adults should exercise for at least 150 minutes each week. The exercise should increase your heart rate and make you sweat (moderate-intensity exercise).  Most adults should also do strengthening exercises at least twice a week. This is in addition to the moderate-intensity exercise.  Maintain a healthy weight  Body mass index (BMI) is a measurement that can be used to identify possible weight problems. It estimates body fat based on height and weight. Your health care provider can help determine your BMI and help you achieve or maintain a healthy weight.  For females 11 years of age and older:   A BMI below 18.5 is considered underweight.  A BMI of 18.5 to  24.9 is normal.  A BMI of 25 to 29.9 is considered overweight.  A BMI of 30 and above is considered obese.  Watch levels of cholesterol and blood lipids  You should start having your blood tested for lipids and cholesterol at 58 years of age, then have this test every 5 years.  You may need to have your cholesterol levels checked more often if:  Your lipid or cholesterol levels are high.  You are older than 58 years of age.  You are at high risk for heart disease.  CANCER SCREENING   Lung Cancer  Lung cancer screening is recommended for adults 43-20 years old who are at high risk for lung cancer because of a history of smoking.  A yearly low-dose CT scan of the lungs is recommended for people who:  Currently smoke.  Have quit within the past 15 years.  Have at least a 30-pack-year history of smoking. A pack year is smoking an average of one pack of cigarettes a day for 1 year.  Yearly screening should continue until it has been 15 years since you quit.  Yearly screening should stop if you develop a health problem that would prevent you from having lung cancer treatment.  Breast Cancer  Practice breast self-awareness. This means understanding how your breasts normally appear and feel.  It also means doing regular breast self-exams. Let your health care provider know about any changes, no matter how small.  If you are in your 75s or  3s, you should have a clinical breast exam (CBE) by a health care provider every 1-3 years as part of a regular health exam.  If you are 36 or older, have a CBE every year. Also consider having a breast X-ray (mammogram) every year.  If you have a family history of breast cancer, talk to your health care provider about genetic screening.  If you are at high risk for breast cancer, talk to your health care provider about having an MRI and a mammogram every year.  Breast cancer gene (BRCA) assessment is recommended for women who have family  members with BRCA-related cancers. BRCA-related cancers include:  Breast.  Ovarian.  Tubal.  Peritoneal cancers.  Results of the assessment will determine the need for genetic counseling and BRCA1 and BRCA2 testing. Cervical Cancer Routine pelvic examinations to screen for cervical cancer are no longer recommended for nonpregnant women who are considered low risk for cancer of the pelvic organs (ovaries, uterus, and vagina) and who do not have symptoms. A pelvic examination may be necessary if you have symptoms including those associated with pelvic infections. Ask your health care provider if a screening pelvic exam is right for you.   The Pap test is the screening test for cervical cancer for women who are considered at risk.  If you had a hysterectomy for a problem that was not cancer or a condition that could lead to cancer, then you no longer need Pap tests.  If you are older than 65 years, and you have had normal Pap tests for the past 10 years, you no longer need to have Pap tests.  If you have had past treatment for cervical cancer or a condition that could lead to cancer, you need Pap tests and screening for cancer for at least 20 years after your treatment.  If you no longer get a Pap test, assess your risk factors if they change (such as having a new sexual partner). This can affect whether you should start being screened again.  Some women have medical problems that increase their chance of getting cervical cancer. If this is the case for you, your health care provider may recommend more frequent screening and Pap tests.  The human papillomavirus (HPV) test is another test that may be used for cervical cancer screening. The HPV test looks for the virus that can cause cell changes in the cervix. The cells collected during the Pap test can be tested for HPV.  The HPV test can be used to screen women 67 years of age and older. Getting tested for HPV can extend the interval  between normal Pap tests from three to five years.  An HPV test also should be used to screen women of any age who have unclear Pap test results.  After 58 years of age, women should have HPV testing as often as Pap tests.  Colorectal Cancer  This type of cancer can be detected and often prevented.  Routine colorectal cancer screening usually begins at 58 years of age and continues through 58 years of age.  Your health care provider may recommend screening at an earlier age if you have risk factors for colon cancer.  Your health care provider may also recommend using home test kits to check for hidden blood in the stool.  A small camera at the end of a tube can be used to examine your colon directly (sigmoidoscopy or colonoscopy). This is done to check for the earliest forms of colorectal cancer.  Routine screening usually begins at age 63.  Direct examination of the colon should be repeated every 5-10 years through 58 years of age. However, you may need to be screened more often if early forms of precancerous polyps or small growths are found. Skin Cancer  Check your skin from head to toe regularly.  Tell your health care provider about any new moles or changes in moles, especially if there is a change in a mole's shape or color.  Also tell your health care provider if you have a mole that is larger than the size of a pencil eraser.  Always use sunscreen. Apply sunscreen liberally and repeatedly throughout the day.  Protect yourself by wearing long sleeves, pants, a wide-brimmed hat, and sunglasses whenever you are outside. HEART DISEASE, DIABETES, AND HIGH BLOOD PRESSURE   Have your blood pressure checked at least every 1-2 years. High blood pressure causes heart disease and increases the risk of stroke.  If you are between 19 years and 40 years old, ask your health care provider if you should take aspirin to prevent strokes.  Have regular diabetes screenings. This involves  taking a blood sample to check your fasting blood sugar level.  If you are at a normal weight and have a low risk for diabetes, have this test once every three years after 58 years of age.  If you are overweight and have a high risk for diabetes, consider being tested at a younger age or more often. PREVENTING INFECTION  Hepatitis B  If you have a higher risk for hepatitis B, you should be screened for this virus. You are considered at high risk for hepatitis B if:  You were born in a country where hepatitis B is common. Ask your health care provider which countries are considered high risk.  Your parents were born in a high-risk country, and you have not been immunized against hepatitis B (hepatitis B vaccine).  You have HIV or AIDS.  You use needles to inject street drugs.  You live with someone who has hepatitis B.  You have had sex with someone who has hepatitis B.  You get hemodialysis treatment.  You take certain medicines for conditions, including cancer, organ transplantation, and autoimmune conditions. Hepatitis C  Blood testing is recommended for:  Everyone born from [birth year] through 1963.  Anyone with known risk factors for hepatitis C. Sexually transmitted infections (STIs)  You should be screened for sexually transmitted infections (STIs) including gonorrhea and chlamydia if:  You are sexually active and are younger than 58 years of age.  You are older than 58 years of age and your health care provider tells you that you are at risk for this type of infection.  Your sexual activity has changed since you were last screened and you are at an increased risk for chlamydia or gonorrhea. Ask your health care provider if you are at risk.  If you do not have HIV, but are at risk, it may be recommended that you take a prescription medicine daily to prevent HIV infection. This is called pre-exposure prophylaxis (PrEP). You are considered at risk if:  You are sexually active  and do not regularly use condoms or know the HIV status of your partner(s).  You take drugs by injection.  You are sexually active with a partner who has HIV. Talk with your health care provider about whether you are at high risk of being infected with HIV. If you choose to begin PrEP, you should first  be tested for HIV. You should then be tested every 3 months for as long as you are taking PrEP.  PREGNANCY   If you are premenopausal and you may become pregnant, ask your health care provider about preconception counseling.  If you may become pregnant, take 400 to 800 micrograms (mcg) of folic acid every day.  If you want to prevent pregnancy, talk to your health care provider about birth control (contraception). OSTEOPOROSIS AND MENOPAUSE   Osteoporosis is a disease in which the bones lose minerals and strength with aging. This can result in serious bone fractures. Your risk for osteoporosis can be identified using a bone density scan.  If you are 6 years of age or older, or if you are at risk for osteoporosis and fractures, ask your health care provider if you should be screened.  Ask your health care provider whether you should take a calcium or vitamin D supplement to lower your risk for osteoporosis.  Menopause may have certain physical symptoms and risks.  Hormone replacement therapy may reduce some of these symptoms and risks. Talk to your health care provider about whether hormone replacement therapy is right for you.  HOME CARE INSTRUCTIONS   Schedule regular health, dental, and eye exams.  Stay current with your immunizations.   Do not use any tobacco products including cigarettes, chewing tobacco, or electronic cigarettes.  If you are pregnant, do not drink alcohol.  If you are breastfeeding, limit how much and how often you drink alcohol.  Limit alcohol intake to no more than 1 drink per day for nonpregnant women. One drink equals 12 ounces of beer, 5 ounces of wine,  or 1 ounces of hard liquor.  Do not use street drugs.  Do not share needles.  Ask your health care provider for help if you need support or information about quitting drugs.  Tell your health care provider if you often feel depressed.  Tell your health care provider if you have ever been abused or do not feel safe at home. Document Released: 04/03/2011 Document Revised: 02/02/2014 Document Reviewed: 08/20/2013 [facility] Patient Information 2015 [place], [place]. This information is not intended to replace advice given to you by your health care provider. Make sure you discuss any questions you have with your health care provider.

## 2016-06-07 NOTE — Progress Notes (Signed)
    Jessica Ramirez 06/28/1956 [id number]        60 y.o.  G0P0  for annual exam.  Doing well from a gynecologic standpoint.  Past medical history,surgical history, problem list, medications, allergies, family history and social history were all reviewed and documented as reviewed in the EPIC chart.  ROS:  Performed with pertinent positives and negatives included in the history, assessment and plan.   Additional significant findings :  None   Exam: Kennon PortelaKim Gardner assistant Vitals:   06/07/16 1600  BP: 120/70  Weight: 206 lb (93.4 kg)  Height: 5\' 1"  (1.549 m)   Body mass index is 38.92 kg/m.  General appearance:  Normal affect, orientation and appearance. Skin: Grossly normal HEENT: Without gross lesions.  No cervical or supraclavicular adenopathy. Thyroid normal.  Lungs:  Clear without wheezing, rales or rhonchi Cardiac: RR, without RMG Abdominal:  Soft, nontender, without masses, guarding, rebound, organomegaly or hernia Breasts:  Examined lying and sitting without masses, retractions, discharge or axillary adenopathy. Pelvic:  Ext/BUS/Vagina normal  Cervix normal  Uterus anteverted, normal size, shape and contour, midline and mobile nontender   Adnexa without masses or tenderness    Anus and perineum normal   Rectovaginal normal sphincter tone without palpated masses or tenderness.    Assessment/Plan:  60 y.o. G0P0 female for annual exam.   1. Postmenopausal/atrophic genital changes. Doing well without significant hot flushes, night sweats, vaginal dryness or any bleeding. Continue monitor and report any issues or vaginal bleeding. 2. History of pruritus ani uses Mycolog cream intermittently with good results. Refill provided. 3. Osteopenia. DEXA 2015 T score -1.1 FRAX 5%/0.3%. Plan repeat DEXA in several years. I asked patient to make sure she is having a vitamin D level checked when she has her routine blood work done through her primary physician's office. 4. Pap smear 2014.  Pap smear done today. No history of significant abnormal Pap smears. We'll plan repeat at 3 year intervals per current screening guidelines. 5. Mammography 01/2016. Continue with annual mammography when due. SBE monthly reviewed. 6. Colonoscopy 2013. Repeat at their recommended interval. 7. Health maintenance. No routine lab work done as patient reports is done elsewhere. Follow up 1 year, sooner as needed.   Dara LordsFONTAINE,Eun Vermeer P MD, 4:21 PM 06/07/2016

## 2016-06-07 NOTE — Addendum Note (Signed)
Addended by: Dayna BarkerGARDNER, KIMBERLY K on: 06/07/2016 04:32 PM   Modules accepted: Orders

## 2016-06-08 LAB — PAP IG AND HPV HIGH-RISK: HPV DNA HIGH RISK: NOT DETECTED

## 2017-04-02 ENCOUNTER — Other Ambulatory Visit: Payer: Self-pay | Admitting: Gynecology

## 2017-04-02 DIAGNOSIS — Z1231 Encounter for screening mammogram for malignant neoplasm of breast: Secondary | ICD-10-CM

## 2017-04-20 ENCOUNTER — Ambulatory Visit
Admission: RE | Admit: 2017-04-20 | Discharge: 2017-04-20 | Disposition: A | Discharge status: Home / Self Care | Payer: 59 | Source: Ambulatory Visit | Attending: Gynecology | Admitting: Gynecology

## 2017-04-20 DIAGNOSIS — Z1231 Encounter for screening mammogram for malignant neoplasm of breast: Secondary | ICD-10-CM

## 2017-05-23 DIAGNOSIS — E7800 Pure hypercholesterolemia, unspecified: Secondary | ICD-10-CM | POA: Diagnosis not present

## 2017-05-23 DIAGNOSIS — M8588 Other specified disorders of bone density and structure, other site: Secondary | ICD-10-CM | POA: Diagnosis not present

## 2017-05-23 DIAGNOSIS — Z0000 Encounter for general adult medical examination without abnormal findings: Secondary | ICD-10-CM | POA: Diagnosis not present

## 2017-05-23 DIAGNOSIS — I10 Essential (primary) hypertension: Secondary | ICD-10-CM | POA: Diagnosis not present

## 2017-05-23 DIAGNOSIS — M79671 Pain in right foot: Secondary | ICD-10-CM | POA: Diagnosis not present

## 2017-09-17 DIAGNOSIS — S8002XA Contusion of left knee, initial encounter: Secondary | ICD-10-CM | POA: Diagnosis not present

## 2017-10-14 DIAGNOSIS — M25461 Effusion, right knee: Secondary | ICD-10-CM | POA: Diagnosis not present

## 2017-10-14 DIAGNOSIS — M1711 Unilateral primary osteoarthritis, right knee: Secondary | ICD-10-CM | POA: Diagnosis not present

## 2017-10-14 DIAGNOSIS — M25561 Pain in right knee: Secondary | ICD-10-CM | POA: Diagnosis not present

## 2017-10-14 DIAGNOSIS — Z91013 Allergy to seafood: Secondary | ICD-10-CM | POA: Diagnosis not present

## 2017-10-14 DIAGNOSIS — Z79899 Other long term (current) drug therapy: Secondary | ICD-10-CM | POA: Diagnosis not present

## 2018-02-26 DIAGNOSIS — M79672 Pain in left foot: Secondary | ICD-10-CM | POA: Diagnosis not present

## 2018-02-26 DIAGNOSIS — G5763 Lesion of plantar nerve, bilateral lower limbs: Secondary | ICD-10-CM | POA: Diagnosis not present

## 2018-02-26 DIAGNOSIS — M79671 Pain in right foot: Secondary | ICD-10-CM | POA: Diagnosis not present

## 2018-03-19 DIAGNOSIS — M7742 Metatarsalgia, left foot: Secondary | ICD-10-CM | POA: Diagnosis not present

## 2018-03-19 DIAGNOSIS — M7741 Metatarsalgia, right foot: Secondary | ICD-10-CM | POA: Diagnosis not present

## 2018-03-19 DIAGNOSIS — M2012 Hallux valgus (acquired), left foot: Secondary | ICD-10-CM | POA: Diagnosis not present

## 2018-03-19 DIAGNOSIS — M2011 Hallux valgus (acquired), right foot: Secondary | ICD-10-CM | POA: Diagnosis not present

## 2018-04-09 ENCOUNTER — Other Ambulatory Visit: Payer: Self-pay | Admitting: Gynecology

## 2018-04-09 DIAGNOSIS — Z1231 Encounter for screening mammogram for malignant neoplasm of breast: Secondary | ICD-10-CM

## 2018-05-01 ENCOUNTER — Ambulatory Visit
Admission: RE | Admit: 2018-05-01 | Discharge: 2018-05-01 | Disposition: A | Discharge status: Home / Self Care | Payer: BLUE CROSS/BLUE SHIELD | Source: Ambulatory Visit | Attending: Gynecology | Admitting: Gynecology

## 2018-05-01 DIAGNOSIS — Z1231 Encounter for screening mammogram for malignant neoplasm of breast: Secondary | ICD-10-CM

## 2018-05-28 DIAGNOSIS — I10 Essential (primary) hypertension: Secondary | ICD-10-CM | POA: Diagnosis not present

## 2018-05-28 DIAGNOSIS — M79671 Pain in right foot: Secondary | ICD-10-CM | POA: Diagnosis not present

## 2018-05-28 DIAGNOSIS — Z23 Encounter for immunization: Secondary | ICD-10-CM | POA: Diagnosis not present

## 2018-05-28 DIAGNOSIS — Z0000 Encounter for general adult medical examination without abnormal findings: Secondary | ICD-10-CM | POA: Diagnosis not present

## 2018-05-28 DIAGNOSIS — E7800 Pure hypercholesterolemia, unspecified: Secondary | ICD-10-CM | POA: Diagnosis not present

## 2018-05-28 DIAGNOSIS — M8588 Other specified disorders of bone density and structure, other site: Secondary | ICD-10-CM | POA: Diagnosis not present

## 2018-06-18 DIAGNOSIS — M79641 Pain in right hand: Secondary | ICD-10-CM | POA: Diagnosis not present

## 2018-08-05 DIAGNOSIS — E2839 Other primary ovarian failure: Secondary | ICD-10-CM | POA: Diagnosis not present

## 2019-03-05 ENCOUNTER — Other Ambulatory Visit: Payer: Self-pay | Admitting: Gynecology

## 2019-03-05 DIAGNOSIS — Z1231 Encounter for screening mammogram for malignant neoplasm of breast: Secondary | ICD-10-CM

## 2019-05-05 ENCOUNTER — Other Ambulatory Visit: Payer: Self-pay

## 2019-05-05 ENCOUNTER — Ambulatory Visit
Admission: RE | Admit: 2019-05-05 | Discharge: 2019-05-05 | Disposition: A | Discharge status: Home / Self Care | Payer: BC Managed Care – PPO | Source: Ambulatory Visit | Attending: Gynecology | Admitting: Gynecology

## 2019-05-05 DIAGNOSIS — Z1231 Encounter for screening mammogram for malignant neoplasm of breast: Secondary | ICD-10-CM

## 2019-06-04 DIAGNOSIS — I10 Essential (primary) hypertension: Secondary | ICD-10-CM | POA: Diagnosis not present

## 2019-06-04 DIAGNOSIS — Z0000 Encounter for general adult medical examination without abnormal findings: Secondary | ICD-10-CM | POA: Diagnosis not present

## 2019-06-04 DIAGNOSIS — M79671 Pain in right foot: Secondary | ICD-10-CM | POA: Diagnosis not present

## 2019-06-04 DIAGNOSIS — M65331 Trigger finger, right middle finger: Secondary | ICD-10-CM | POA: Diagnosis not present

## 2019-06-04 DIAGNOSIS — E7800 Pure hypercholesterolemia, unspecified: Secondary | ICD-10-CM | POA: Diagnosis not present

## 2019-07-01 ENCOUNTER — Encounter: Payer: Self-pay | Admitting: Gynecology

## 2019-07-01 DIAGNOSIS — I10 Essential (primary) hypertension: Secondary | ICD-10-CM | POA: Diagnosis not present

## 2019-07-01 DIAGNOSIS — E7800 Pure hypercholesterolemia, unspecified: Secondary | ICD-10-CM | POA: Diagnosis not present

## 2019-07-01 DIAGNOSIS — Z23 Encounter for immunization: Secondary | ICD-10-CM | POA: Diagnosis not present

## 2020-04-09 ENCOUNTER — Other Ambulatory Visit: Payer: Self-pay | Admitting: Family Medicine

## 2020-04-09 DIAGNOSIS — Z1231 Encounter for screening mammogram for malignant neoplasm of breast: Secondary | ICD-10-CM

## 2020-05-05 ENCOUNTER — Ambulatory Visit
Admission: RE | Admit: 2020-05-05 | Discharge: 2020-05-05 | Disposition: A | Discharge status: Home / Self Care | Payer: BC Managed Care – PPO | Source: Ambulatory Visit | Attending: *Deleted | Admitting: *Deleted

## 2020-05-05 ENCOUNTER — Other Ambulatory Visit: Payer: Self-pay

## 2020-05-05 DIAGNOSIS — Z1231 Encounter for screening mammogram for malignant neoplasm of breast: Secondary | ICD-10-CM | POA: Diagnosis not present

## 2020-06-14 ENCOUNTER — Other Ambulatory Visit (HOSPITAL_COMMUNITY)
Admission: RE | Admit: 2020-06-14 | Discharge: 2020-06-14 | Disposition: A | Discharge status: Home / Self Care | Payer: BC Managed Care – PPO | Source: Ambulatory Visit | Attending: Family Medicine | Admitting: Family Medicine

## 2020-06-14 DIAGNOSIS — Z124 Encounter for screening for malignant neoplasm of cervix: Secondary | ICD-10-CM | POA: Diagnosis not present

## 2020-06-14 DIAGNOSIS — Z0000 Encounter for general adult medical examination without abnormal findings: Secondary | ICD-10-CM | POA: Diagnosis not present

## 2020-06-14 DIAGNOSIS — E7800 Pure hypercholesterolemia, unspecified: Secondary | ICD-10-CM | POA: Diagnosis not present

## 2020-06-14 DIAGNOSIS — I10 Essential (primary) hypertension: Secondary | ICD-10-CM | POA: Diagnosis not present

## 2020-06-14 DIAGNOSIS — M79671 Pain in right foot: Secondary | ICD-10-CM | POA: Diagnosis not present

## 2020-06-14 DIAGNOSIS — Z23 Encounter for immunization: Secondary | ICD-10-CM | POA: Diagnosis not present

## 2020-06-16 LAB — CYTOLOGY - PAP
Comment: NEGATIVE
Diagnosis: NEGATIVE
High risk HPV: NEGATIVE

## 2020-10-07 DIAGNOSIS — Z23 Encounter for immunization: Secondary | ICD-10-CM | POA: Diagnosis not present

## 2020-10-07 DIAGNOSIS — M25512 Pain in left shoulder: Secondary | ICD-10-CM | POA: Diagnosis not present

## 2021-05-04 ENCOUNTER — Other Ambulatory Visit: Payer: Self-pay | Admitting: Family Medicine

## 2021-05-04 DIAGNOSIS — Z1231 Encounter for screening mammogram for malignant neoplasm of breast: Secondary | ICD-10-CM

## 2021-06-15 DIAGNOSIS — I10 Essential (primary) hypertension: Secondary | ICD-10-CM | POA: Diagnosis not present

## 2021-06-15 DIAGNOSIS — E7800 Pure hypercholesterolemia, unspecified: Secondary | ICD-10-CM | POA: Diagnosis not present

## 2021-06-15 DIAGNOSIS — Z0000 Encounter for general adult medical examination without abnormal findings: Secondary | ICD-10-CM | POA: Diagnosis not present

## 2021-06-15 DIAGNOSIS — M79671 Pain in right foot: Secondary | ICD-10-CM | POA: Diagnosis not present

## 2021-06-24 ENCOUNTER — Ambulatory Visit: Payer: BC Managed Care – PPO

## 2021-07-06 ENCOUNTER — Other Ambulatory Visit: Payer: Self-pay

## 2021-07-06 ENCOUNTER — Ambulatory Visit
Admission: RE | Admit: 2021-07-06 | Discharge: 2021-07-06 | Disposition: A | Discharge status: Home / Self Care | Payer: BC Managed Care – PPO | Source: Ambulatory Visit | Attending: *Deleted | Admitting: *Deleted

## 2021-07-06 DIAGNOSIS — Z1231 Encounter for screening mammogram for malignant neoplasm of breast: Secondary | ICD-10-CM

## 2021-07-11 ENCOUNTER — Other Ambulatory Visit: Payer: Self-pay | Admitting: Family Medicine

## 2021-07-11 DIAGNOSIS — R928 Other abnormal and inconclusive findings on diagnostic imaging of breast: Secondary | ICD-10-CM

## 2021-07-15 ENCOUNTER — Other Ambulatory Visit: Payer: Self-pay

## 2021-07-15 ENCOUNTER — Ambulatory Visit: Payer: BC Managed Care – PPO

## 2021-07-15 ENCOUNTER — Ambulatory Visit
Admission: RE | Admit: 2021-07-15 | Discharge: 2021-07-15 | Disposition: A | Discharge status: Home / Self Care | Payer: BC Managed Care – PPO | Source: Ambulatory Visit | Attending: Family Medicine | Admitting: Family Medicine

## 2021-07-15 DIAGNOSIS — R928 Other abnormal and inconclusive findings on diagnostic imaging of breast: Secondary | ICD-10-CM

## 2021-07-26 ENCOUNTER — Other Ambulatory Visit: Payer: BC Managed Care – PPO

## 2022-02-20 DIAGNOSIS — M25511 Pain in right shoulder: Secondary | ICD-10-CM | POA: Diagnosis not present

## 2022-04-26 DIAGNOSIS — M79642 Pain in left hand: Secondary | ICD-10-CM | POA: Diagnosis not present

## 2022-04-26 DIAGNOSIS — R2232 Localized swelling, mass and lump, left upper limb: Secondary | ICD-10-CM | POA: Diagnosis not present

## 2022-04-26 DIAGNOSIS — M10042 Idiopathic gout, left hand: Secondary | ICD-10-CM | POA: Diagnosis not present

## 2022-05-22 DIAGNOSIS — L02213 Cutaneous abscess of chest wall: Secondary | ICD-10-CM | POA: Diagnosis not present

## 2022-06-26 ENCOUNTER — Other Ambulatory Visit: Payer: Self-pay | Admitting: Family Medicine

## 2022-06-26 DIAGNOSIS — Z1231 Encounter for screening mammogram for malignant neoplasm of breast: Secondary | ICD-10-CM

## 2022-07-25 ENCOUNTER — Ambulatory Visit
Admission: RE | Admit: 2022-07-25 | Discharge: 2022-07-25 | Disposition: A | Discharge status: Home / Self Care | Payer: BC Managed Care – PPO | Source: Ambulatory Visit | Attending: Family Medicine | Admitting: Family Medicine

## 2022-07-25 DIAGNOSIS — Z1231 Encounter for screening mammogram for malignant neoplasm of breast: Secondary | ICD-10-CM | POA: Diagnosis not present

## 2022-07-25 DIAGNOSIS — M1990 Unspecified osteoarthritis, unspecified site: Secondary | ICD-10-CM | POA: Diagnosis not present

## 2022-07-25 DIAGNOSIS — Z23 Encounter for immunization: Secondary | ICD-10-CM | POA: Diagnosis not present

## 2022-07-25 DIAGNOSIS — E7800 Pure hypercholesterolemia, unspecified: Secondary | ICD-10-CM | POA: Diagnosis not present

## 2022-07-25 DIAGNOSIS — Z0000 Encounter for general adult medical examination without abnormal findings: Secondary | ICD-10-CM | POA: Diagnosis not present

## 2022-07-25 DIAGNOSIS — M79671 Pain in right foot: Secondary | ICD-10-CM | POA: Diagnosis not present

## 2022-07-25 DIAGNOSIS — I10 Essential (primary) hypertension: Secondary | ICD-10-CM | POA: Diagnosis not present

## 2022-10-09 DIAGNOSIS — E7800 Pure hypercholesterolemia, unspecified: Secondary | ICD-10-CM | POA: Diagnosis not present

## 2022-10-09 DIAGNOSIS — Z1211 Encounter for screening for malignant neoplasm of colon: Secondary | ICD-10-CM | POA: Diagnosis not present

## 2022-10-09 DIAGNOSIS — I10 Essential (primary) hypertension: Secondary | ICD-10-CM | POA: Diagnosis not present

## 2022-10-09 DIAGNOSIS — Z1231 Encounter for screening mammogram for malignant neoplasm of breast: Secondary | ICD-10-CM | POA: Diagnosis not present

## 2022-10-18 DIAGNOSIS — M19042 Primary osteoarthritis, left hand: Secondary | ICD-10-CM | POA: Diagnosis not present

## 2022-10-18 DIAGNOSIS — M180 Bilateral primary osteoarthritis of first carpometacarpal joints: Secondary | ICD-10-CM | POA: Diagnosis not present

## 2022-10-18 DIAGNOSIS — M85831 Other specified disorders of bone density and structure, right forearm: Secondary | ICD-10-CM | POA: Diagnosis not present

## 2022-10-18 DIAGNOSIS — M19041 Primary osteoarthritis, right hand: Secondary | ICD-10-CM | POA: Diagnosis not present

## 2022-12-12 DIAGNOSIS — M0579 Rheumatoid arthritis with rheumatoid factor of multiple sites without organ or systems involvement: Secondary | ICD-10-CM | POA: Diagnosis not present

## 2023-01-16 DIAGNOSIS — M05731 Rheumatoid arthritis with rheumatoid factor of right wrist without organ or systems involvement: Secondary | ICD-10-CM | POA: Diagnosis not present

## 2023-01-16 DIAGNOSIS — Z79899 Other long term (current) drug therapy: Secondary | ICD-10-CM | POA: Diagnosis not present

## 2023-01-16 DIAGNOSIS — D84821 Immunodeficiency due to drugs: Secondary | ICD-10-CM | POA: Diagnosis not present

## 2023-01-16 DIAGNOSIS — M05732 Rheumatoid arthritis with rheumatoid factor of left wrist without organ or systems involvement: Secondary | ICD-10-CM | POA: Diagnosis not present

## 2023-01-16 DIAGNOSIS — M0579 Rheumatoid arthritis with rheumatoid factor of multiple sites without organ or systems involvement: Secondary | ICD-10-CM | POA: Diagnosis not present

## 2023-02-28 DIAGNOSIS — M0579 Rheumatoid arthritis with rheumatoid factor of multiple sites without organ or systems involvement: Secondary | ICD-10-CM | POA: Diagnosis not present

## 2023-02-28 DIAGNOSIS — Z79899 Other long term (current) drug therapy: Secondary | ICD-10-CM | POA: Diagnosis not present

## 2023-02-28 DIAGNOSIS — M05732 Rheumatoid arthritis with rheumatoid factor of left wrist without organ or systems involvement: Secondary | ICD-10-CM | POA: Diagnosis not present

## 2023-02-28 DIAGNOSIS — M05731 Rheumatoid arthritis with rheumatoid factor of right wrist without organ or systems involvement: Secondary | ICD-10-CM | POA: Diagnosis not present

## 2023-03-15 DIAGNOSIS — M05731 Rheumatoid arthritis with rheumatoid factor of right wrist without organ or systems involvement: Secondary | ICD-10-CM | POA: Diagnosis not present

## 2023-03-15 DIAGNOSIS — M05732 Rheumatoid arthritis with rheumatoid factor of left wrist without organ or systems involvement: Secondary | ICD-10-CM | POA: Diagnosis not present

## 2023-03-15 DIAGNOSIS — M25511 Pain in right shoulder: Secondary | ICD-10-CM | POA: Diagnosis not present

## 2023-04-09 DIAGNOSIS — M0579 Rheumatoid arthritis with rheumatoid factor of multiple sites without organ or systems involvement: Secondary | ICD-10-CM | POA: Diagnosis not present

## 2023-04-09 DIAGNOSIS — I10 Essential (primary) hypertension: Secondary | ICD-10-CM | POA: Diagnosis not present

## 2023-04-09 DIAGNOSIS — Z23 Encounter for immunization: Secondary | ICD-10-CM | POA: Diagnosis not present

## 2023-04-09 DIAGNOSIS — Z1211 Encounter for screening for malignant neoplasm of colon: Secondary | ICD-10-CM | POA: Diagnosis not present

## 2023-04-09 DIAGNOSIS — Z780 Asymptomatic menopausal state: Secondary | ICD-10-CM | POA: Diagnosis not present

## 2023-05-03 DIAGNOSIS — M0579 Rheumatoid arthritis with rheumatoid factor of multiple sites without organ or systems involvement: Secondary | ICD-10-CM | POA: Diagnosis not present

## 2023-05-03 DIAGNOSIS — Z79899 Other long term (current) drug therapy: Secondary | ICD-10-CM | POA: Diagnosis not present

## 2023-05-03 DIAGNOSIS — M05731 Rheumatoid arthritis with rheumatoid factor of right wrist without organ or systems involvement: Secondary | ICD-10-CM | POA: Diagnosis not present

## 2023-05-03 DIAGNOSIS — M05732 Rheumatoid arthritis with rheumatoid factor of left wrist without organ or systems involvement: Secondary | ICD-10-CM | POA: Diagnosis not present

## 2023-06-23 DIAGNOSIS — S91312A Laceration without foreign body, left foot, initial encounter: Secondary | ICD-10-CM | POA: Diagnosis not present

## 2023-07-10 DIAGNOSIS — Z1211 Encounter for screening for malignant neoplasm of colon: Secondary | ICD-10-CM | POA: Diagnosis not present

## 2023-07-10 DIAGNOSIS — K648 Other hemorrhoids: Secondary | ICD-10-CM | POA: Diagnosis not present

## 2023-07-19 ENCOUNTER — Other Ambulatory Visit: Payer: Self-pay | Admitting: Internal Medicine

## 2023-07-19 DIAGNOSIS — Z1231 Encounter for screening mammogram for malignant neoplasm of breast: Secondary | ICD-10-CM

## 2023-08-07 DIAGNOSIS — Z780 Asymptomatic menopausal state: Secondary | ICD-10-CM | POA: Diagnosis not present

## 2023-08-07 DIAGNOSIS — M8589 Other specified disorders of bone density and structure, multiple sites: Secondary | ICD-10-CM | POA: Diagnosis not present

## 2023-08-07 DIAGNOSIS — M85851 Other specified disorders of bone density and structure, right thigh: Secondary | ICD-10-CM | POA: Diagnosis not present

## 2023-08-13 ENCOUNTER — Ambulatory Visit
Admission: RE | Admit: 2023-08-13 | Discharge: 2023-08-13 | Disposition: A | Discharge status: Home / Self Care | Payer: BC Managed Care – PPO | Source: Ambulatory Visit | Attending: Internal Medicine | Admitting: Internal Medicine

## 2023-08-13 DIAGNOSIS — Z1231 Encounter for screening mammogram for malignant neoplasm of breast: Secondary | ICD-10-CM

## 2023-08-16 ENCOUNTER — Other Ambulatory Visit: Payer: Self-pay | Admitting: Internal Medicine

## 2023-08-16 DIAGNOSIS — R928 Other abnormal and inconclusive findings on diagnostic imaging of breast: Secondary | ICD-10-CM

## 2023-09-05 ENCOUNTER — Other Ambulatory Visit: Payer: BC Managed Care – PPO

## 2023-09-05 DIAGNOSIS — D84821 Immunodeficiency due to drugs: Secondary | ICD-10-CM | POA: Diagnosis not present

## 2023-09-05 DIAGNOSIS — M05731 Rheumatoid arthritis with rheumatoid factor of right wrist without organ or systems involvement: Secondary | ICD-10-CM | POA: Diagnosis not present

## 2023-09-05 DIAGNOSIS — Z79899 Other long term (current) drug therapy: Secondary | ICD-10-CM | POA: Diagnosis not present

## 2023-09-05 DIAGNOSIS — M05732 Rheumatoid arthritis with rheumatoid factor of left wrist without organ or systems involvement: Secondary | ICD-10-CM | POA: Diagnosis not present

## 2023-09-17 ENCOUNTER — Other Ambulatory Visit: Payer: Self-pay | Admitting: Internal Medicine

## 2023-09-17 ENCOUNTER — Ambulatory Visit
Admission: RE | Admit: 2023-09-17 | Discharge: 2023-09-17 | Disposition: A | Discharge status: Home / Self Care | Payer: BC Managed Care – PPO | Source: Ambulatory Visit | Attending: Internal Medicine | Admitting: Internal Medicine

## 2023-09-17 DIAGNOSIS — R928 Other abnormal and inconclusive findings on diagnostic imaging of breast: Secondary | ICD-10-CM | POA: Diagnosis not present

## 2023-09-17 DIAGNOSIS — N6489 Other specified disorders of breast: Secondary | ICD-10-CM

## 2023-09-17 DIAGNOSIS — N6320 Unspecified lump in the left breast, unspecified quadrant: Secondary | ICD-10-CM

## 2023-12-17 ENCOUNTER — Other Ambulatory Visit: Payer: BC Managed Care – PPO

## 2024-01-10 ENCOUNTER — Ambulatory Visit
Admission: RE | Admit: 2024-01-10 | Discharge: 2024-01-10 | Disposition: A | Discharge status: Home / Self Care | Source: Ambulatory Visit | Attending: Internal Medicine | Admitting: Internal Medicine

## 2024-01-10 DIAGNOSIS — N6489 Other specified disorders of breast: Secondary | ICD-10-CM

## 2024-01-10 DIAGNOSIS — R928 Other abnormal and inconclusive findings on diagnostic imaging of breast: Secondary | ICD-10-CM

## 2024-01-10 DIAGNOSIS — N6320 Unspecified lump in the left breast, unspecified quadrant: Secondary | ICD-10-CM
